# Patient Record
Sex: Male | Born: 1985 | Race: Black or African American | Hispanic: No | Marital: Single | Smoking: Former smoker
Health system: Southern US, Community
[De-identification: ages and names within clinical notes are randomized; demographics above are authoritative.]

## PROBLEM LIST (undated history)

## (undated) DIAGNOSIS — J45909 Unspecified asthma, uncomplicated: Secondary | ICD-10-CM

## (undated) HISTORY — DX: Unspecified asthma, uncomplicated: J45.909

---
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
## 2008-08-13 ENCOUNTER — Emergency Department (HOSPITAL_COMMUNITY): Admission: EM | Admit: 2008-08-13 | Discharge: 2008-08-13 | Payer: Self-pay | Admitting: Emergency Medicine

## 2009-04-30 ENCOUNTER — Emergency Department (HOSPITAL_COMMUNITY): Admission: EM | Admit: 2009-04-30 | Discharge: 2009-04-30 | Payer: Self-pay | Admitting: Emergency Medicine

## 2010-08-08 LAB — GC/CHLAMYDIA PROBE AMP, GENITAL
Chlamydia, DNA Probe: POSITIVE — AB
GC Probe Amp, Genital: NEGATIVE

## 2012-11-06 ENCOUNTER — Ambulatory Visit: Payer: Self-pay | Admitting: Physician Assistant

## 2012-11-06 VITALS — BP 110/74 | HR 61 | Temp 98.0°F | Ht 62.5 in | Wt 167.2 lb

## 2012-11-06 DIAGNOSIS — N489 Disorder of penis, unspecified: Secondary | ICD-10-CM

## 2012-11-06 DIAGNOSIS — L738 Other specified follicular disorders: Secondary | ICD-10-CM

## 2012-11-06 DIAGNOSIS — N4889 Other specified disorders of penis: Secondary | ICD-10-CM

## 2012-11-06 DIAGNOSIS — L739 Follicular disorder, unspecified: Secondary | ICD-10-CM

## 2012-11-06 MED ORDER — MUPIROCIN 2 % EX OINT: TOPICAL_OINTMENT | Freq: Three times a day (TID) | CUTANEOUS | Status: DC

## 2012-11-06 NOTE — Progress Notes (Signed)
  Subjective:    Patient ID: Thomas Moon, male    DOB: 03-31-1986, 27 y.o.   MRN: [id number]  HPI    Mr. Fulford is a pleasant 27 yr old male here with concern for a lesion on his penis.  States that maybe 2 weeks ago he noted 1 bump on the shaft of his penis.  He popped it, and it spontaneously resolved.  Yesterday he noted 4 bumps on the shaft of his penis, which concerned him that perhaps this was herpes.  He has never had anything like this before.  He states the areas are not painful, but are a little tender if touched.  The area is not red.  He does admit that a little fluid came out when he popped the bumps.  Denies purulent drainage.  He has no personal hx of HSV.  His partner has no hx of HSV.  He is currently sexually active with 1 male partner.  They do not use condoms.  He declines any other STI testing today, concerned for cost.  States he has been negative in the past, as has his partner.  He denies fever, chills, malaise, abd pain, dysuria.   Review of Systems  Constitutional: Negative for fever, chills and fatigue.  HENT: Negative.   Respiratory: Negative.   Cardiovascular: Negative.   Gastrointestinal: Negative.   Genitourinary: Positive for genital sores. Negative for dysuria, discharge, penile swelling, penile pain and testicular pain.  Musculoskeletal: Negative.   Skin: Negative.   Neurological: Negative.        Objective:   Physical Exam  Vitals reviewed. Constitutional: He is oriented to person, place, and time. He appears well-developed and well-nourished. No distress.  HENT:  Head: Normocephalic and atraumatic.  Eyes: Conjunctivae are normal. No scleral icterus.  Cardiovascular: Normal rate, regular rhythm and normal heart sounds.   Pulmonary/Chest: Effort normal. He has no wheezes. He has no rales.  Abdominal: Soft. There is no tenderness.  Genitourinary: Testes normal and penis normal.    No discharge found.  Pt with few fine papules on dorsal aspect of  penis; there is no erythema; the papules do not appear vesicular; they occur on hair-bearing areas; slight TTP; two lesions have been opened by pt and are now scabbed/crusted; pt also with multiple scabs/pustules on thighs, appears c/w folliculitis  Neurological: He is alert and oriented to person, place, and time.  Skin: Skin is warm and dry.  Psychiatric: He has a normal mood and affect. His behavior is normal.        Assessment & Plan:  Penile lesion - Plan: Herpes simplex virus culture  Folliculitis - Plan: mupirocin ointment (BACTROBAN) 2 %   Mr. Breeden is a 27 yr old male here with concern for penile lesion.  The area of concern appears consistent with folliculitis.  The patient also has pustules/folliculitis on thighs as well.  Pt is quite concerned that this my be HSV.  I have collected a viral culture.  The lesions do not appear herpetic - they are neither erythematous nor vesicular.  The lesions are only mildly tender and appear to resolve quickly.  He does not have any systemic symptoms.  Discussed this with pt at length and reassured him that I do not think this represents HSV.  Will try topical mupirocin TID to affected areas.  Will follow up on HSV culture.  If any symptoms worsening, not improving, changing, pt to let us know.

## 2012-11-06 NOTE — Patient Instructions (Addendum)
I think the area you are concerned about is a folliculitis (bacterial infection) rather than HSV.  Begin using the mupirocin ointment three times daily to affected areas.  I will let you know when your labs are back.  If anything is changing or worsening, please let me know.   Folliculitis  Folliculitis is redness, soreness, and swelling (inflammation) of the hair follicles. This condition can occur anywhere on the body. People with weakened immune systems, diabetes, or obesity have a greater risk of getting folliculitis. CAUSES  Bacterial infection. This is the most common cause.  Fungal infection.  Viral infection.  Contact with certain chemicals, especially oils and tars. Long-term folliculitis can result from bacteria that live in the nostrils. The bacteria may trigger multiple outbreaks of folliculitis over time. SYMPTOMS Folliculitis most commonly occurs on the scalp, thighs, legs, back, buttocks, and areas where hair is shaved frequently. An early sign of folliculitis is a small, white or yellow, pus-filled, itchy lesion (pustule). These lesions appear on a red, inflamed follicle. They are usually less than 0.2 inches (5 mm) wide. When there is an infection of the follicle that goes deeper, it becomes a boil or furuncle. A group of closely packed boils creates a larger lesion (carbuncle). Carbuncles tend to occur in hairy, sweaty areas of the body. DIAGNOSIS  Your caregiver can usually tell what is wrong by doing a physical exam. A sample may be taken from one of the lesions and tested in a lab. This can help determine what is causing your folliculitis. TREATMENT  Treatment may include:  Applying warm compresses to the affected areas.  Taking antibiotic medicines orally or applying them to the skin.  Draining the lesions if they contain a large amount of pus or fluid.  Laser hair removal for cases of long-lasting folliculitis. This helps to prevent regrowth of the hair. HOME CARE  INSTRUCTIONS  Apply warm compresses to the affected areas as directed by your caregiver.  If antibiotics are prescribed, take them as directed. Finish them even if you start to feel better.  You may take over-the-counter medicines to relieve itching.  Do not shave irritated skin.  Follow up with your caregiver as directed. SEEK IMMEDIATE MEDICAL CARE IF:   You have increasing redness, swelling, or pain in the affected area.  You have a fever. MAKE SURE YOU:  Understand these instructions.  Will watch your condition.  Will get help right away if you are not doing well or get worse. Document Released: 06/24/2001 Document Revised: 10/15/2011 Document Reviewed: 07/16/2011 [facility] Patient Information 2014 [place], [place].

## 2012-11-09 LAB — HERPES SIMPLEX VIRUS CULTURE: Organism ID, Bacteria: DETECTED

## 2012-11-10 ENCOUNTER — Telehealth: Payer: Self-pay | Admitting: Physician Assistant

## 2012-11-10 ENCOUNTER — Telehealth: Payer: Self-pay

## 2012-11-10 DIAGNOSIS — B009 Herpesviral infection, unspecified: Secondary | ICD-10-CM

## 2012-11-10 MED ORDER — ACYCLOVIR 200 MG PO CAPS: 400.0000 mg | ORAL_CAPSULE | Freq: Two times a day (BID) | ORAL | Status: DC

## 2012-11-10 NOTE — Telephone Encounter (Signed)
Agree  Thank you

## 2012-11-10 NOTE — Telephone Encounter (Signed)
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  Discussed options for treating current outbreak, though pt states the lesions are nearly gone and no longer painful.  Discussed the possibility of future outbreaks and of potentially spreading infection to a partner.  Discussed that we are not able to eradicate the infection, that he will continue to be infected, but that it is impossible to predict if/when he will have another outbreak.  Discussed suppressive therapy.  Pt would like to start suppressive therapy.  He is uninsured and would like the cheapest medication possible.  Will do acyclovir BID as the 200mg  caps are on the [facility] $4 list.  Encouraged pt to use condoms during sexual encounters to protect against STIs and also to prevent spread of HSV.  Pt expresses understanding and knows to call if he has further questions or concerns, I am happy to speak with him at any time.

## 2012-11-10 NOTE — Telephone Encounter (Signed)
Pt is calling back in regards to his visit  Call back number is [phone]

## 2012-11-10 NOTE — Telephone Encounter (Signed)
I called him again. He wanted to know his "numbers" so he would know how long he has had HSV. I advised him there are no numbers, there is a positive or negative, he is positive, and there is no way to tell how long he has had this. To you FYI

## 2016-08-27 ENCOUNTER — Encounter: Payer: Self-pay | Admitting: Family Medicine

## 2016-08-27 ENCOUNTER — Ambulatory Visit (INDEPENDENT_AMBULATORY_CARE_PROVIDER_SITE_OTHER): Payer: Self-pay

## 2016-08-27 ENCOUNTER — Ambulatory Visit (INDEPENDENT_AMBULATORY_CARE_PROVIDER_SITE_OTHER): Payer: Self-pay | Admitting: Family Medicine

## 2016-08-27 VITALS — BP 125/70 | HR 53 | Temp 98.5°F | Resp 17 | Ht 63.0 in | Wt 162.0 lb

## 2016-08-27 DIAGNOSIS — R05 Cough: Secondary | ICD-10-CM

## 2016-08-27 DIAGNOSIS — Z113 Encounter for screening for infections with a predominantly sexual mode of transmission: Secondary | ICD-10-CM

## 2016-08-27 DIAGNOSIS — R002 Palpitations: Secondary | ICD-10-CM

## 2016-08-27 DIAGNOSIS — Z13220 Encounter for screening for lipoid disorders: Secondary | ICD-10-CM

## 2016-08-27 DIAGNOSIS — R059 Cough, unspecified: Secondary | ICD-10-CM

## 2016-08-27 NOTE — Patient Instructions (Addendum)
   IF you received an x-ray today, you will receive an invoice from [facility]. Please contact [facility] at [phone] with questions or concerns regarding your invoice.   IF you received labwork today, you will receive an invoice from [facility]. Please contact [facility] at [phone] with questions or concerns regarding your invoice.   Our billing staff will not be able to assist you with questions regarding bills from these companies.  You will be contacted with the lab results as soon as they are available. The fastest way to get your results is to activate your My Chart account. Instructions are located on the last page of this paperwork. If you have not heard from us regarding the results in 2 weeks, please contact this office.      Preventive Care 18-39 Years, Male Preventive care refers to lifestyle choices and visits with your health care provider that can promote health and wellness. What does preventive care include?  A yearly physical exam. This is also called an annual well check.  Dental exams once or twice a year.  Routine eye exams. Ask your health care provider how often you should have your eyes checked.  Personal lifestyle choices, including: ? Daily care of your teeth and gums. ? Regular physical activity. ? Eating a healthy diet. ? Avoiding tobacco and drug use. ? Limiting alcohol use. ? Practicing safe sex. What happens during an annual well check? The services and screenings done by your health care provider during your annual well check will depend on your age, overall health, lifestyle risk factors, and family history of disease. Counseling Your health care provider may ask you questions about your:  Alcohol use.  Tobacco use.  Drug use.  Emotional well-being.  Home and relationship well-being.  Sexual activity.  Eating habits.  Work and work environment.  Screening You may have the following tests or  measurements:  Height, weight, and BMI.  Blood pressure.  Lipid and cholesterol levels. These may be checked every 5 years starting at age 20.  Diabetes screening. This is done by checking your blood sugar (glucose) after you have not eaten for a while (fasting).  Skin check.  Hepatitis C blood test.  Hepatitis B blood test.  Sexually transmitted disease (STD) testing.  Discuss your test results, treatment options, and if necessary, the need for more tests with your health care provider. Vaccines Your health care provider may recommend certain vaccines, such as:  Influenza vaccine. This is recommended every year.  Tetanus, diphtheria, and acellular pertussis (Tdap, Td) vaccine. You may need a Td booster every 10 years.  Varicella vaccine. You may need this if you have not been vaccinated.  HPV vaccine. If you are 26 or younger, you may need three doses over 6 months.  Measles, mumps, and rubella (MMR) vaccine. You may need at least one dose of MMR.You may also need a second dose.  Pneumococcal 13-valent conjugate (PCV13) vaccine. You may need this if you have certain conditions and have not been vaccinated.  Pneumococcal polysaccharide (PPSV23) vaccine. You may need one or two doses if you smoke cigarettes or if you have certain conditions.  Meningococcal vaccine. One dose is recommended if you are age 19-21 years and a first-year college student living in a residence hall, or if you have one of several medical conditions. You may also need additional booster doses.  Hepatitis A vaccine. You may need this if you have certain conditions or if you travel or work in   places where you may be exposed to hepatitis A.  Hepatitis B vaccine. You may need this if you have certain conditions or if you travel or work in places where you may be exposed to hepatitis B.  Haemophilus influenzae type b (Hib) vaccine. You may need this if you have certain risk factors.  Talk to your health  care provider about which screenings and vaccines you need and how often you need them. This information is not intended to replace advice given to you by your health care provider. Make sure you discuss any questions you have with your health care provider. Document Released: 06/11/2001 Document Revised: 01/03/2016 Document Reviewed: 02/14/2015 Elsevier Interactive Patient Education  2017 [employer] Inc[employer]  

## 2016-08-27 NOTE — Progress Notes (Signed)
Chief Complaint  Patient presents with  . chest congestion    phlegm in chest onset 6 months    HPI  Pt reports that he has been having phlegm in his chest for 6 months He reports that he has a cough for six months He reports that environmental changes makes it worse He smokes marijuana wrapped in tobacco about twice a week and has been doing so since age 31 and notices lately it makes him cough more. He notices his cough is worse when he is at home and has a vent above the head of his bed where he sleeps on his back.  He denies reflux symptoms. Denies choking symptoms.  He reports that his father passed pneumonia at age 10  He reports that he exercises and does cardio. He always has a low heart rate and denies dizziness or lightheadedness. He states that he notices that his heart rate speeds up and slows down  He states that he felt like his heart was about to jump out of his chest He attributes that it might be due to a pre-work out supplement  He does not have a primary care physician He states that would like an evaluation today His mother has hyperlipidemia  He reports that he went to school at [employer] for sport medicine He is doing photography    Past Medical History:  Diagnosis Date  . Asthma     No current outpatient prescriptions on file.   No current facility-administered medications for this visit.     Allergies: No Known Allergies  History reviewed. No pertinent surgical history.  Social History   Social History  . Marital status: Single    Spouse name: N/A  . Number of children: N/A  . Years of education: N/A   Social History Main Topics  . Smoking status: Former Games developer  . Smokeless tobacco: Never Used  . Alcohol use 1.8 oz/week    3 Shots of liquor per week  . Drug use: Yes    Types: Marijuana  . Sexual activity: Yes   Other Topics Concern  . None   Social History Narrative  . None    ROS  Objective: Vitals:   08/27/16 1434  BP:  125/70  Pulse: (!) 53  Resp: 17  Temp: 98.5 F (36.9 C)  TempSrc: Oral  SpO2: 99%  Weight: 162 lb (73.5 kg)  Height:  (1.6 m)    Physical Exam  Constitutional: He is oriented to person, place, and time. He appears well-developed and well-nourished.  HENT:  Head: Normocephalic and atraumatic.  Right Ear: External ear normal.  Left Ear: External ear normal.  Nose: Nose normal.  Mouth/Throat: Oropharynx is clear and moist. No oropharyngeal exudate.  Eyes: Conjunctivae and EOM are normal.  Cardiovascular: Normal rate, regular rhythm, normal heart sounds and intact distal pulses.   No murmur heard. Pulmonary/Chest: Effort normal and breath sounds normal. No respiratory distress. He has no wheezes. He has no rales. He exhibits no tenderness.  Normal chest expansion with inhalation  Abdominal: Soft. Bowel sounds are normal. He exhibits no distension and no mass. There is no tenderness. There is no rebound and no guarding. No hernia.  Neurological: He is alert and oriented to person, place, and time.  Skin: Skin is warm. Capillary refill takes less than 2 seconds. No erythema.  Psychiatric: He has a normal mood and affect. His behavior is normal. Judgment and thought content normal.   CLINICAL DATA:  30  y/o  M; six months of cough.  EXAM: CHEST  2 VIEW  COMPARISON:  None.  FINDINGS: The heart size and mediastinal contours are within normal limits. Both lungs are clear. The visualized skeletal structures are unremarkable.  IMPRESSION: No active cardiopulmonary disease.   Electronically Signed   By: Mitzi Hansen M.D.   On: 08/27/2016 15:24  Assessment and Plan Elgie was seen today for chest congestion.  Diagnoses and all orders for this visit:  Cough- discussed that he should adjust his environment and add a humidifier and to also cover his vent above the head of the bed He should also use a saline nasal spray to moisten his airway He is likely  coughing due to dry air Avoid smoking  -     DG Chest 2 View  Palpitations- advised to avoid stimulants -     DG Chest 2 View  Screening, lipid- will screen today, discussed screening in every 5 days -     Lipid panel  Screen for STD (sexually transmitted disease)- discussed screening today, advised condom useage -     GC/Chlamydia Probe Amp -     RPR -     HIV 1 RNA quant-no reflex-bld     [place] [facility] [place]

## 2016-08-28 LAB — LIPID PANEL
CHOLESTEROL TOTAL: 151 mg/dL (ref 100–199)
Chol/HDL Ratio: 2.5 ratio (ref 0.0–5.0)
HDL: 61 mg/dL (ref >39)
LDL Calculated: 78 mg/dL (ref 0–99)
TRIGLYCERIDES: 60 mg/dL (ref 0–149)
VLDL Cholesterol Cal: 12 mg/dL (ref 5–40)

## 2016-08-28 LAB — RPR: RPR: NONREACTIVE

## 2016-08-28 LAB — GC/CHLAMYDIA PROBE AMP
Chlamydia trachomatis, NAA: NEGATIVE
Neisseria gonorrhoeae by PCR: NEGATIVE

## 2016-08-28 LAB — HIV-1 RNA QUANT-NO REFLEX-BLD

## 2018-12-13 IMAGING — DX DG CHEST 2V
2 series · 2 of 2 positions shown · non-contrast
Comparison: None.

CLINICAL DATA: 30 y/o  M; six months of cough.

EXAM:
CHEST  2 VIEW

[chest pa]
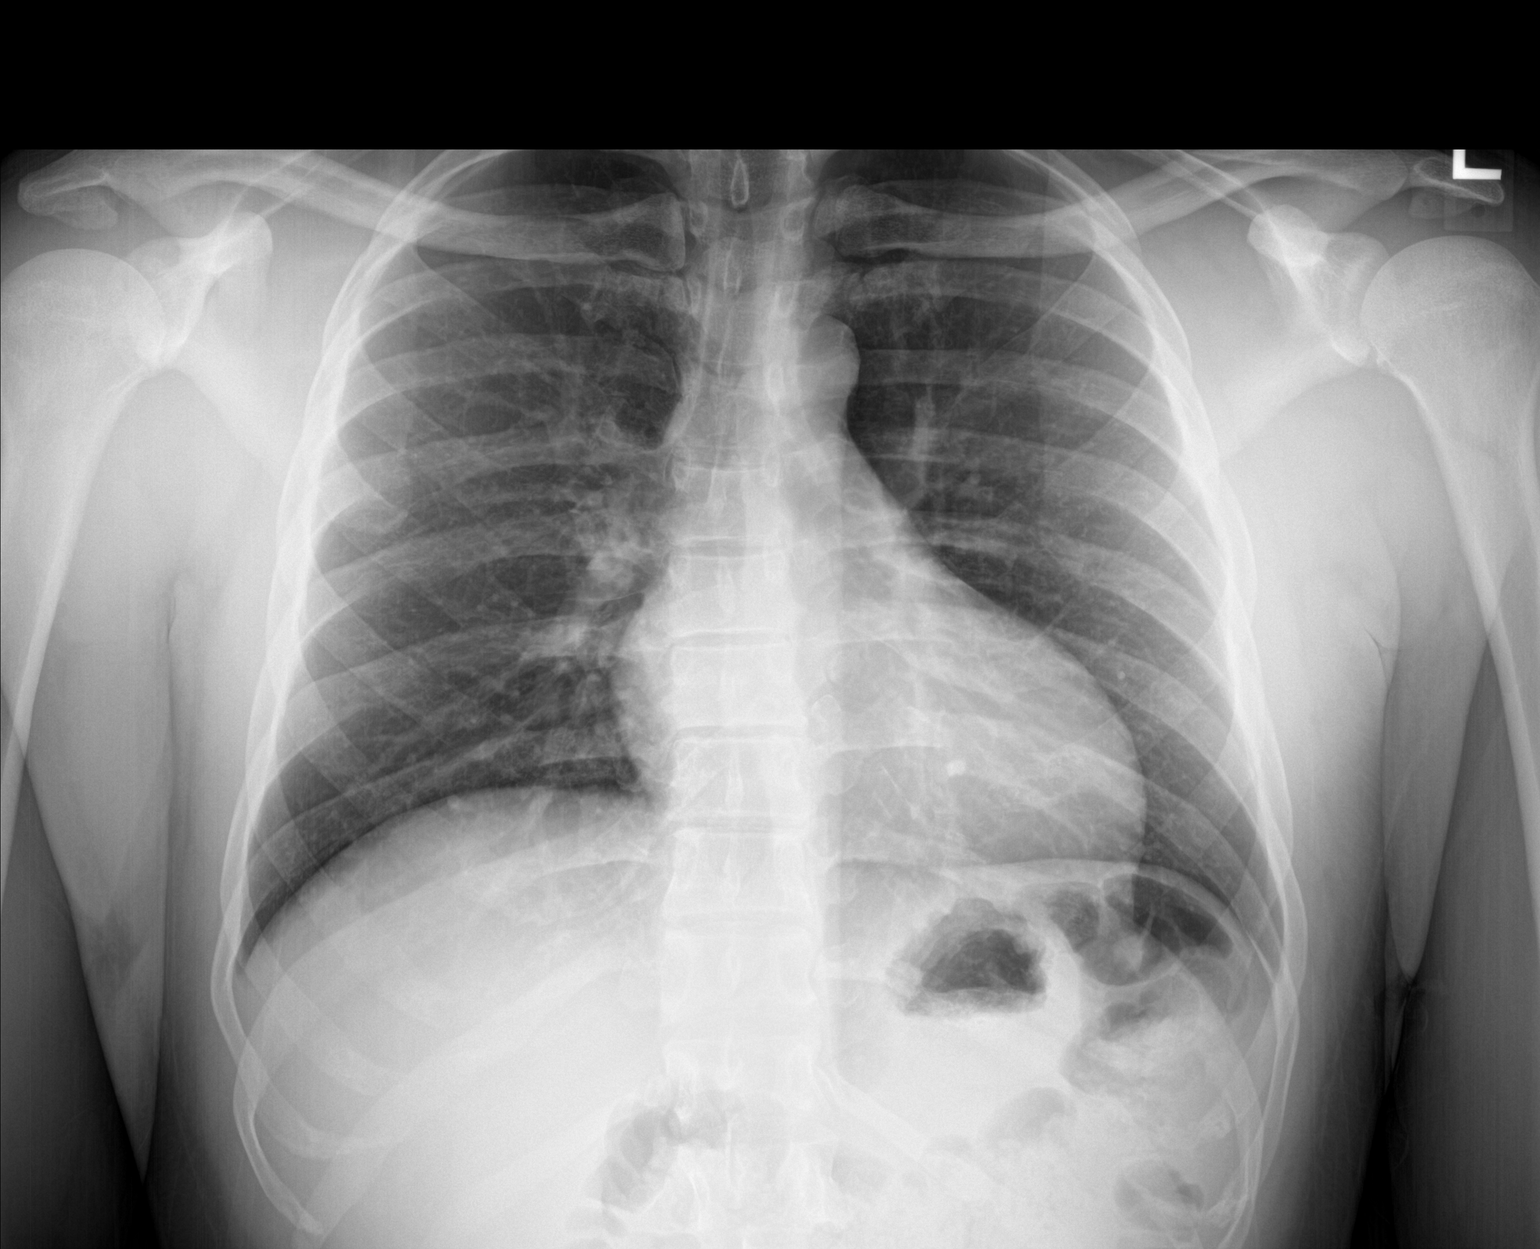

[chest lat]
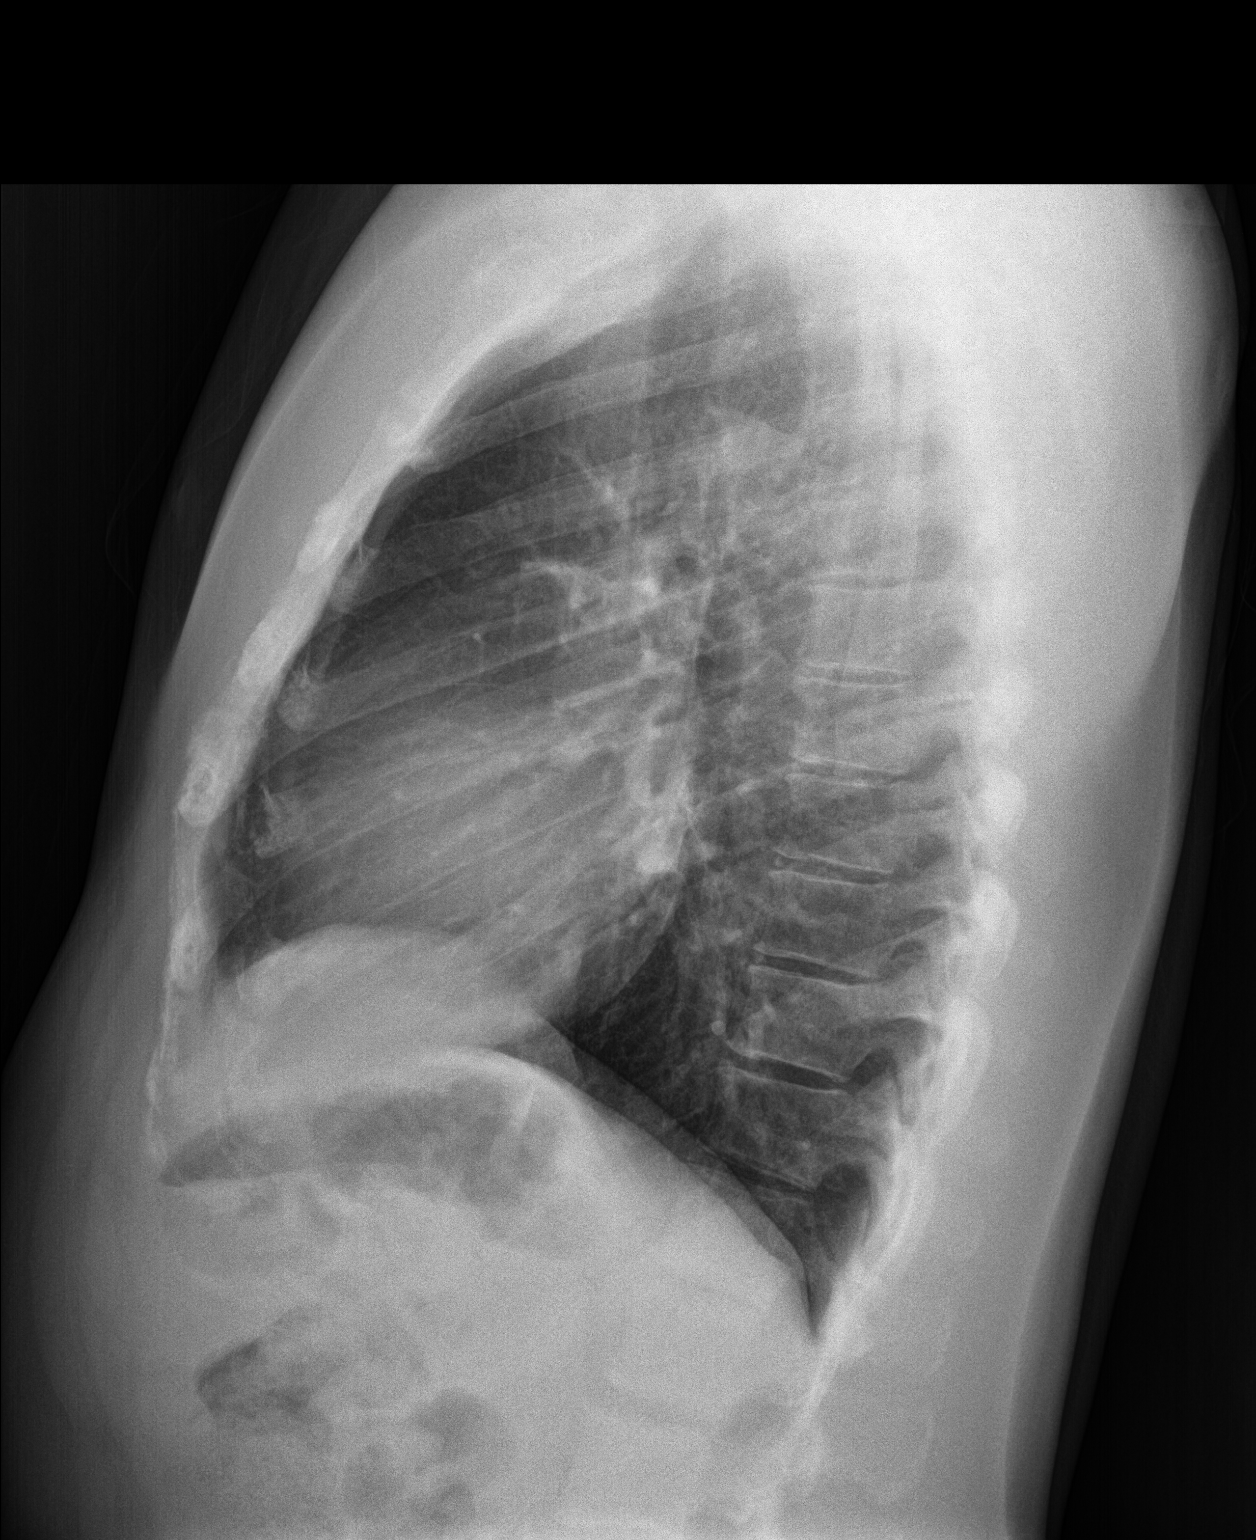

[2 of 2 positions shown; findings below may reference images not displayed]

FINDINGS: The heart size and mediastinal contours are within normal limits.
Both lungs are clear. The visualized skeletal structures are
unremarkable.
IMPRESSION: No active cardiopulmonary disease.

By: Nazareth Jumper M.D.
